# Patient Record
Sex: Female | Born: 1975 | Race: White | Hispanic: No | Marital: Single | Smoking: Former smoker
Health system: Southern US, Community
[De-identification: ages and names within clinical notes are randomized; demographics above are authoritative.]

## PROBLEM LIST (undated history)

## (undated) DIAGNOSIS — F419 Anxiety disorder, unspecified: Secondary | ICD-10-CM

## (undated) DIAGNOSIS — F4310 Post-traumatic stress disorder, unspecified: Secondary | ICD-10-CM

## (undated) DIAGNOSIS — E079 Disorder of thyroid, unspecified: Secondary | ICD-10-CM

## (undated) DIAGNOSIS — J45909 Unspecified asthma, uncomplicated: Secondary | ICD-10-CM

## (undated) DIAGNOSIS — F329 Major depressive disorder, single episode, unspecified: Secondary | ICD-10-CM

## (undated) DIAGNOSIS — F32A Depression, unspecified: Secondary | ICD-10-CM

## (undated) HISTORY — DX: Depression, unspecified: F32.A

## (undated) HISTORY — DX: Post-traumatic stress disorder, unspecified: F43.10

## (undated) HISTORY — DX: Anxiety disorder, unspecified: F41.9

## (undated) HISTORY — DX: Unspecified asthma, uncomplicated: J45.909

## (undated) HISTORY — DX: Major depressive disorder, single episode, unspecified: F32.9

## (undated) HISTORY — DX: Disorder of thyroid, unspecified: E07.9

---

## 2012-09-09 DIAGNOSIS — N978 Female infertility of other origin: Secondary | ICD-10-CM | POA: Insufficient documentation

## 2015-08-26 DIAGNOSIS — R0602 Shortness of breath: Secondary | ICD-10-CM | POA: Diagnosis not present

## 2015-08-26 DIAGNOSIS — J019 Acute sinusitis, unspecified: Secondary | ICD-10-CM | POA: Diagnosis not present

## 2015-08-26 DIAGNOSIS — Z79899 Other long term (current) drug therapy: Secondary | ICD-10-CM | POA: Diagnosis not present

## 2015-08-26 DIAGNOSIS — J383 Other diseases of vocal cords: Secondary | ICD-10-CM | POA: Insufficient documentation

## 2015-08-26 DIAGNOSIS — R064 Hyperventilation: Secondary | ICD-10-CM | POA: Diagnosis not present

## 2015-08-26 DIAGNOSIS — Z7951 Long term (current) use of inhaled steroids: Secondary | ICD-10-CM | POA: Diagnosis not present

## 2015-08-26 DIAGNOSIS — E039 Hypothyroidism, unspecified: Secondary | ICD-10-CM | POA: Diagnosis not present

## 2015-08-26 DIAGNOSIS — J4530 Mild persistent asthma, uncomplicated: Secondary | ICD-10-CM | POA: Insufficient documentation

## 2015-09-01 DIAGNOSIS — M79672 Pain in left foot: Secondary | ICD-10-CM | POA: Diagnosis not present

## 2015-09-01 DIAGNOSIS — M7741 Metatarsalgia, right foot: Secondary | ICD-10-CM | POA: Diagnosis not present

## 2015-09-01 DIAGNOSIS — R262 Difficulty in walking, not elsewhere classified: Secondary | ICD-10-CM | POA: Diagnosis not present

## 2015-09-01 DIAGNOSIS — D2372 Other benign neoplasm of skin of left lower limb, including hip: Secondary | ICD-10-CM | POA: Diagnosis not present

## 2015-09-01 DIAGNOSIS — M7742 Metatarsalgia, left foot: Secondary | ICD-10-CM | POA: Diagnosis not present

## 2015-09-06 DIAGNOSIS — Z79899 Other long term (current) drug therapy: Secondary | ICD-10-CM | POA: Diagnosis not present

## 2015-09-06 DIAGNOSIS — Z87891 Personal history of nicotine dependence: Secondary | ICD-10-CM | POA: Diagnosis not present

## 2015-09-06 DIAGNOSIS — J300 Vasomotor rhinitis: Secondary | ICD-10-CM | POA: Diagnosis not present

## 2015-09-06 DIAGNOSIS — E039 Hypothyroidism, unspecified: Secondary | ICD-10-CM | POA: Diagnosis not present

## 2015-09-06 DIAGNOSIS — J4530 Mild persistent asthma, uncomplicated: Secondary | ICD-10-CM | POA: Diagnosis not present

## 2015-09-06 DIAGNOSIS — J383 Other diseases of vocal cords: Secondary | ICD-10-CM | POA: Diagnosis not present

## 2015-09-06 DIAGNOSIS — R0982 Postnasal drip: Secondary | ICD-10-CM | POA: Diagnosis not present

## 2015-09-07 DIAGNOSIS — R0982 Postnasal drip: Secondary | ICD-10-CM | POA: Insufficient documentation

## 2015-09-09 DIAGNOSIS — M71572 Other bursitis, not elsewhere classified, left ankle and foot: Secondary | ICD-10-CM | POA: Diagnosis not present

## 2015-09-09 DIAGNOSIS — M2011 Hallux valgus (acquired), right foot: Secondary | ICD-10-CM | POA: Diagnosis not present

## 2015-09-09 DIAGNOSIS — M79671 Pain in right foot: Secondary | ICD-10-CM | POA: Diagnosis not present

## 2015-09-09 DIAGNOSIS — M79672 Pain in left foot: Secondary | ICD-10-CM | POA: Diagnosis not present

## 2015-09-09 DIAGNOSIS — M71571 Other bursitis, not elsewhere classified, right ankle and foot: Secondary | ICD-10-CM | POA: Diagnosis not present

## 2015-09-09 DIAGNOSIS — M2012 Hallux valgus (acquired), left foot: Secondary | ICD-10-CM | POA: Diagnosis not present

## 2015-09-27 DIAGNOSIS — M2012 Hallux valgus (acquired), left foot: Secondary | ICD-10-CM | POA: Diagnosis not present

## 2015-09-27 DIAGNOSIS — M79672 Pain in left foot: Secondary | ICD-10-CM | POA: Diagnosis not present

## 2015-09-27 DIAGNOSIS — M7741 Metatarsalgia, right foot: Secondary | ICD-10-CM | POA: Diagnosis not present

## 2015-09-27 DIAGNOSIS — M7742 Metatarsalgia, left foot: Secondary | ICD-10-CM | POA: Diagnosis not present

## 2015-09-27 DIAGNOSIS — R262 Difficulty in walking, not elsewhere classified: Secondary | ICD-10-CM | POA: Diagnosis not present

## 2015-09-27 DIAGNOSIS — M2011 Hallux valgus (acquired), right foot: Secondary | ICD-10-CM | POA: Diagnosis not present

## 2015-09-27 DIAGNOSIS — M79671 Pain in right foot: Secondary | ICD-10-CM | POA: Diagnosis not present

## 2015-10-04 DIAGNOSIS — J383 Other diseases of vocal cords: Secondary | ICD-10-CM | POA: Diagnosis not present

## 2015-10-04 DIAGNOSIS — R490 Dysphonia: Secondary | ICD-10-CM | POA: Diagnosis not present

## 2015-11-07 DIAGNOSIS — M2012 Hallux valgus (acquired), left foot: Secondary | ICD-10-CM | POA: Diagnosis not present

## 2015-11-07 DIAGNOSIS — M79672 Pain in left foot: Secondary | ICD-10-CM | POA: Diagnosis not present

## 2015-11-07 DIAGNOSIS — S90822A Blister (nonthermal), left foot, initial encounter: Secondary | ICD-10-CM | POA: Diagnosis not present

## 2015-12-16 DIAGNOSIS — E039 Hypothyroidism, unspecified: Secondary | ICD-10-CM | POA: Diagnosis not present

## 2015-12-29 DIAGNOSIS — B373 Candidiasis of vulva and vagina: Secondary | ICD-10-CM | POA: Diagnosis not present

## 2015-12-29 DIAGNOSIS — L709 Acne, unspecified: Secondary | ICD-10-CM | POA: Diagnosis not present

## 2015-12-29 DIAGNOSIS — Z131 Encounter for screening for diabetes mellitus: Secondary | ICD-10-CM | POA: Diagnosis not present

## 2015-12-29 LAB — VITAMIN D 25 HYDROXY (VIT D DEFICIENCY, FRACTURES): Vit D, 25-Hydroxy: 57

## 2015-12-29 LAB — CBC AND DIFFERENTIAL: HEMOGLOBIN: 13.6 g/dL (ref 12.0–16.0)

## 2015-12-29 LAB — HEMOGLOBIN A1C: Hemoglobin A1C: 4.8

## 2015-12-29 LAB — BASIC METABOLIC PANEL: Creatinine: 0.8 mg/dL (ref 0.5–1.1)

## 2015-12-29 LAB — VITAMIN B12: VITAMIN B12: 931

## 2016-01-31 ENCOUNTER — Ambulatory Visit (INDEPENDENT_AMBULATORY_CARE_PROVIDER_SITE_OTHER): Payer: Self-pay | Admitting: Family Medicine

## 2016-01-31 DIAGNOSIS — Z5329 Procedure and treatment not carried out because of patient's decision for other reasons: Secondary | ICD-10-CM

## 2016-01-31 NOTE — Progress Notes (Signed)
No show 1st visit. Reschedule PRN.

## 2016-03-28 ENCOUNTER — Encounter: Payer: Self-pay | Admitting: Family Medicine

## 2016-03-28 ENCOUNTER — Ambulatory Visit (INDEPENDENT_AMBULATORY_CARE_PROVIDER_SITE_OTHER): Payer: Medicare Other | Admitting: Family Medicine

## 2016-03-28 VITALS — BP 119/79 | HR 107 | Temp 98.6°F | Ht 65.0 in | Wt 178.0 lb

## 2016-03-28 DIAGNOSIS — M169 Osteoarthritis of hip, unspecified: Secondary | ICD-10-CM | POA: Insufficient documentation

## 2016-03-28 DIAGNOSIS — F32A Depression, unspecified: Secondary | ICD-10-CM | POA: Insufficient documentation

## 2016-03-28 DIAGNOSIS — F4310 Post-traumatic stress disorder, unspecified: Secondary | ICD-10-CM | POA: Insufficient documentation

## 2016-03-28 DIAGNOSIS — J4530 Mild persistent asthma, uncomplicated: Secondary | ICD-10-CM

## 2016-03-28 DIAGNOSIS — E559 Vitamin D deficiency, unspecified: Secondary | ICD-10-CM | POA: Insufficient documentation

## 2016-03-28 DIAGNOSIS — F419 Anxiety disorder, unspecified: Secondary | ICD-10-CM | POA: Diagnosis not present

## 2016-03-28 DIAGNOSIS — L709 Acne, unspecified: Secondary | ICD-10-CM | POA: Insufficient documentation

## 2016-03-28 DIAGNOSIS — E039 Hypothyroidism, unspecified: Secondary | ICD-10-CM | POA: Insufficient documentation

## 2016-03-28 DIAGNOSIS — E669 Obesity, unspecified: Secondary | ICD-10-CM | POA: Insufficient documentation

## 2016-03-28 DIAGNOSIS — F329 Major depressive disorder, single episode, unspecified: Secondary | ICD-10-CM

## 2016-03-28 DIAGNOSIS — L700 Acne vulgaris: Secondary | ICD-10-CM

## 2016-03-28 MED ORDER — NAPROXEN-ESOMEPRAZOLE 500-20 MG PO TBEC: 1.0000 | DELAYED_RELEASE_TABLET | Freq: Two times a day (BID) | ORAL | 12 refills | Status: DC | PRN

## 2016-03-28 MED ORDER — PROAIR HFA 108 (90 BASE) MCG/ACT IN AERS: 1.0000 | INHALATION_SPRAY | Freq: Four times a day (QID) | RESPIRATORY_TRACT | 11 refills | Status: DC | PRN

## 2016-03-28 MED ORDER — DULOXETINE HCL 60 MG PO CPEP: 60.0000 mg | ORAL_CAPSULE | Freq: Every day | ORAL | 6 refills | Status: DC

## 2016-03-28 MED ORDER — CLINDAMYCIN PHOSPHATE 1 % EX GEL: CUTANEOUS | 5 refills | Status: AC

## 2016-03-28 MED ORDER — ADVAIR DISKUS 250-50 MCG/DOSE IN AEPB: 1.0000 | INHALATION_SPRAY | Freq: Two times a day (BID) | RESPIRATORY_TRACT | 6 refills | Status: DC | PRN

## 2016-03-28 MED ORDER — DULOXETINE HCL 30 MG PO CPEP: 30.0000 mg | ORAL_CAPSULE | Freq: Every day | ORAL | 6 refills | Status: DC

## 2016-03-28 MED ORDER — LEVOTHYROXINE SODIUM 100 MCG PO TABS: 100.0000 ug | ORAL_TABLET | Freq: Every day | ORAL | 1 refills | Status: AC

## 2016-03-28 NOTE — Patient Instructions (Signed)
Thank you for coming in today. Lets get records from your other office.  Return in 1 month or so.  Restart medicine.  Let me know if you do not hear form therapy.

## 2016-03-28 NOTE — Progress Notes (Signed)
Pt here to establish care.
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
## 2016-03-29 ENCOUNTER — Encounter: Payer: Self-pay | Admitting: Family Medicine

## 2016-03-30 ENCOUNTER — Encounter: Payer: Self-pay | Admitting: Family Medicine

## 2016-03-30 MED ORDER — DULOXETINE HCL 30 MG PO CPEP: 90.0000 mg | ORAL_CAPSULE | Freq: Every day | ORAL | 6 refills | Status: AC

## 2016-03-30 MED ORDER — CYCLOBENZAPRINE HCL 5 MG PO TABS: 5.0000 mg | ORAL_TABLET | Freq: Three times a day (TID) | ORAL | 1 refills | Status: DC | PRN

## 2016-04-03 MED ORDER — FLUCONAZOLE 150 MG PO TABS: 150.0000 mg | ORAL_TABLET | Freq: Once | ORAL | 0 refills | Status: AC

## 2016-04-25 ENCOUNTER — Ambulatory Visit: Payer: Medicare Other | Admitting: Family Medicine

## 2016-04-26 ENCOUNTER — Encounter: Payer: Self-pay | Admitting: Family Medicine

## 2016-06-12 ENCOUNTER — Other Ambulatory Visit: Payer: Self-pay | Admitting: Family Medicine

## 2016-06-12 MED ORDER — ALBUTEROL SULFATE HFA 108 (90 BASE) MCG/ACT IN AERS: 2.0000 | INHALATION_SPRAY | Freq: Four times a day (QID) | RESPIRATORY_TRACT | 2 refills | Status: AC | PRN

## 2016-06-12 NOTE — Progress Notes (Signed)
Letter received from insurance company. Pro-air brand is not covered. Will switch to Ventolin.

## 2016-06-28 ENCOUNTER — Encounter: Payer: Self-pay | Admitting: Family Medicine

## 2016-07-06 ENCOUNTER — Other Ambulatory Visit: Payer: Self-pay | Admitting: Family Medicine

## 2016-07-08 ENCOUNTER — Encounter: Payer: Self-pay | Admitting: Family Medicine

## 2016-07-10 MED ORDER — NAPROXEN 500 MG PO TABS: 500.0000 mg | ORAL_TABLET | Freq: Two times a day (BID) | ORAL | 3 refills | Status: DC

## 2016-07-10 MED ORDER — OMEPRAZOLE 40 MG PO CPDR
40.0000 mg | DELAYED_RELEASE_CAPSULE | Freq: Every day | ORAL | 3 refills | Status: AC
Start: 2016-07-10 — End: ?

## 2016-07-16 DIAGNOSIS — R259 Unspecified abnormal involuntary movements: Secondary | ICD-10-CM | POA: Diagnosis not present

## 2016-07-16 DIAGNOSIS — R443 Hallucinations, unspecified: Secondary | ICD-10-CM | POA: Diagnosis not present

## 2016-07-16 DIAGNOSIS — F430 Acute stress reaction: Secondary | ICD-10-CM | POA: Diagnosis not present

## 2016-07-18 DIAGNOSIS — F333 Major depressive disorder, recurrent, severe with psychotic symptoms: Secondary | ICD-10-CM | POA: Diagnosis not present

## 2016-07-18 DIAGNOSIS — F29 Unspecified psychosis not due to a substance or known physiological condition: Secondary | ICD-10-CM | POA: Diagnosis not present

## 2016-07-19 DIAGNOSIS — F22 Delusional disorders: Secondary | ICD-10-CM | POA: Diagnosis not present

## 2016-07-22 DIAGNOSIS — F332 Major depressive disorder, recurrent severe without psychotic features: Secondary | ICD-10-CM | POA: Diagnosis not present

## 2016-07-25 DIAGNOSIS — F333 Major depressive disorder, recurrent, severe with psychotic symptoms: Secondary | ICD-10-CM | POA: Diagnosis not present

## 2016-07-25 DIAGNOSIS — F22 Delusional disorders: Secondary | ICD-10-CM | POA: Diagnosis not present

## 2016-10-12 DIAGNOSIS — R197 Diarrhea, unspecified: Secondary | ICD-10-CM | POA: Diagnosis not present

## 2016-10-12 DIAGNOSIS — R110 Nausea: Secondary | ICD-10-CM | POA: Diagnosis not present

## 2016-10-30 DIAGNOSIS — S0990XA Unspecified injury of head, initial encounter: Secondary | ICD-10-CM | POA: Diagnosis not present

## 2016-10-30 DIAGNOSIS — R51 Headache: Secondary | ICD-10-CM | POA: Diagnosis not present

## 2016-10-30 DIAGNOSIS — F609 Personality disorder, unspecified: Secondary | ICD-10-CM | POA: Diagnosis not present

## 2016-10-30 DIAGNOSIS — Z9114 Patient's other noncompliance with medication regimen: Secondary | ICD-10-CM | POA: Diagnosis not present

## 2016-10-30 DIAGNOSIS — F333 Major depressive disorder, recurrent, severe with psychotic symptoms: Secondary | ICD-10-CM | POA: Diagnosis not present

## 2016-10-30 DIAGNOSIS — S199XXA Unspecified injury of neck, initial encounter: Secondary | ICD-10-CM | POA: Diagnosis not present

## 2016-10-30 DIAGNOSIS — F69 Unspecified disorder of adult personality and behavior: Secondary | ICD-10-CM | POA: Diagnosis not present

## 2016-10-30 DIAGNOSIS — F22 Delusional disorders: Secondary | ICD-10-CM | POA: Diagnosis not present

## 2016-12-20 DIAGNOSIS — R197 Diarrhea, unspecified: Secondary | ICD-10-CM | POA: Diagnosis not present

## 2016-12-21 DIAGNOSIS — R197 Diarrhea, unspecified: Secondary | ICD-10-CM | POA: Diagnosis not present

## 2017-01-03 ENCOUNTER — Other Ambulatory Visit: Payer: Self-pay | Admitting: Family Medicine

## 2017-01-06 ENCOUNTER — Other Ambulatory Visit: Payer: Self-pay | Admitting: Family Medicine

## 2017-01-12 DIAGNOSIS — R197 Diarrhea, unspecified: Secondary | ICD-10-CM | POA: Diagnosis not present

## 2017-01-12 DIAGNOSIS — A045 Campylobacter enteritis: Secondary | ICD-10-CM | POA: Diagnosis not present

## 2017-01-17 ENCOUNTER — Encounter: Payer: Self-pay | Admitting: Family Medicine

## 2017-01-17 ENCOUNTER — Other Ambulatory Visit: Payer: Self-pay

## 2017-01-17 MED ORDER — NAPROXEN 500 MG PO TABS
500.0000 mg | ORAL_TABLET | Freq: Two times a day (BID) | ORAL | 3 refills | Status: DC
Start: 2017-01-17 — End: 2017-05-06

## 2017-01-21 DIAGNOSIS — R197 Diarrhea, unspecified: Secondary | ICD-10-CM | POA: Diagnosis not present

## 2017-01-21 DIAGNOSIS — R631 Polydipsia: Secondary | ICD-10-CM | POA: Diagnosis not present

## 2017-01-21 DIAGNOSIS — K591 Functional diarrhea: Secondary | ICD-10-CM | POA: Diagnosis not present

## 2017-01-21 DIAGNOSIS — Z79899 Other long term (current) drug therapy: Secondary | ICD-10-CM | POA: Diagnosis not present

## 2017-01-22 ENCOUNTER — Encounter: Payer: Self-pay | Admitting: Family Medicine

## 2017-01-29 DIAGNOSIS — R197 Diarrhea, unspecified: Secondary | ICD-10-CM | POA: Diagnosis not present

## 2017-02-25 DIAGNOSIS — R197 Diarrhea, unspecified: Secondary | ICD-10-CM | POA: Diagnosis not present

## 2017-02-25 DIAGNOSIS — F4310 Post-traumatic stress disorder, unspecified: Secondary | ICD-10-CM | POA: Diagnosis not present

## 2017-02-25 DIAGNOSIS — E039 Hypothyroidism, unspecified: Secondary | ICD-10-CM | POA: Diagnosis not present

## 2017-02-25 DIAGNOSIS — Z79899 Other long term (current) drug therapy: Secondary | ICD-10-CM | POA: Diagnosis not present

## 2017-02-25 DIAGNOSIS — R7989 Other specified abnormal findings of blood chemistry: Secondary | ICD-10-CM | POA: Diagnosis not present

## 2017-02-25 DIAGNOSIS — F2089 Other schizophrenia: Secondary | ICD-10-CM | POA: Diagnosis not present

## 2017-02-26 DIAGNOSIS — R197 Diarrhea, unspecified: Secondary | ICD-10-CM | POA: Diagnosis not present

## 2017-02-26 DIAGNOSIS — Z79899 Other long term (current) drug therapy: Secondary | ICD-10-CM | POA: Diagnosis not present

## 2017-02-26 DIAGNOSIS — E039 Hypothyroidism, unspecified: Secondary | ICD-10-CM | POA: Diagnosis not present

## 2017-03-08 DIAGNOSIS — Z1389 Encounter for screening for other disorder: Secondary | ICD-10-CM | POA: Diagnosis not present

## 2017-03-08 DIAGNOSIS — Z1379 Encounter for other screening for genetic and chromosomal anomalies: Secondary | ICD-10-CM | POA: Diagnosis not present

## 2017-03-08 DIAGNOSIS — R5383 Other fatigue: Secondary | ICD-10-CM | POA: Diagnosis not present

## 2017-03-08 DIAGNOSIS — E559 Vitamin D deficiency, unspecified: Secondary | ICD-10-CM | POA: Diagnosis not present

## 2017-03-08 DIAGNOSIS — Z136 Encounter for screening for cardiovascular disorders: Secondary | ICD-10-CM | POA: Diagnosis not present

## 2017-03-08 DIAGNOSIS — F339 Major depressive disorder, recurrent, unspecified: Secondary | ICD-10-CM | POA: Diagnosis not present

## 2017-03-08 DIAGNOSIS — F419 Anxiety disorder, unspecified: Secondary | ICD-10-CM | POA: Diagnosis not present

## 2017-03-08 DIAGNOSIS — E669 Obesity, unspecified: Secondary | ICD-10-CM | POA: Diagnosis not present

## 2017-03-08 DIAGNOSIS — R197 Diarrhea, unspecified: Secondary | ICD-10-CM | POA: Diagnosis not present

## 2017-03-13 DIAGNOSIS — R197 Diarrhea, unspecified: Secondary | ICD-10-CM | POA: Diagnosis not present

## 2017-03-27 DIAGNOSIS — R197 Diarrhea, unspecified: Secondary | ICD-10-CM | POA: Diagnosis not present

## 2017-04-06 ENCOUNTER — Other Ambulatory Visit: Payer: Self-pay | Admitting: Family Medicine

## 2017-04-09 MED ORDER — FLUTICASONE-SALMETEROL 250-50 MCG/DOSE IN AEPB: 1.0000 | INHALATION_SPRAY | Freq: Two times a day (BID) | RESPIRATORY_TRACT | 0 refills | Status: AC

## 2017-04-09 NOTE — Addendum Note (Signed)
Addended by: Doree Albee on: 04/09/2017 07:52 AM   Modules accepted: Orders

## 2017-05-06 ENCOUNTER — Other Ambulatory Visit: Payer: Self-pay | Admitting: Family Medicine

## 2017-05-14 DIAGNOSIS — E039 Hypothyroidism, unspecified: Secondary | ICD-10-CM | POA: Diagnosis not present

## 2017-05-14 DIAGNOSIS — N911 Secondary amenorrhea: Secondary | ICD-10-CM | POA: Diagnosis not present

## 2017-05-27 DIAGNOSIS — E039 Hypothyroidism, unspecified: Secondary | ICD-10-CM | POA: Diagnosis not present

## 2017-05-27 DIAGNOSIS — A09 Infectious gastroenteritis and colitis, unspecified: Secondary | ICD-10-CM | POA: Diagnosis not present

## 2017-05-27 DIAGNOSIS — E669 Obesity, unspecified: Secondary | ICD-10-CM | POA: Diagnosis not present

## 2017-05-27 DIAGNOSIS — Z1389 Encounter for screening for other disorder: Secondary | ICD-10-CM | POA: Diagnosis not present

## 2017-05-27 DIAGNOSIS — F4310 Post-traumatic stress disorder, unspecified: Secondary | ICD-10-CM | POA: Diagnosis not present

## 2017-05-27 DIAGNOSIS — Z23 Encounter for immunization: Secondary | ICD-10-CM | POA: Diagnosis not present

## 2017-05-27 DIAGNOSIS — J45909 Unspecified asthma, uncomplicated: Secondary | ICD-10-CM | POA: Diagnosis not present

## 2017-05-28 DIAGNOSIS — A09 Infectious gastroenteritis and colitis, unspecified: Secondary | ICD-10-CM | POA: Diagnosis not present

## 2017-06-04 DIAGNOSIS — N912 Amenorrhea, unspecified: Secondary | ICD-10-CM | POA: Diagnosis not present

## 2017-06-04 DIAGNOSIS — E039 Hypothyroidism, unspecified: Secondary | ICD-10-CM | POA: Diagnosis not present

## 2017-06-04 DIAGNOSIS — E221 Hyperprolactinemia: Secondary | ICD-10-CM | POA: Diagnosis not present

## 2017-06-18 ENCOUNTER — Other Ambulatory Visit: Payer: Self-pay | Admitting: Endocrinology

## 2017-06-18 DIAGNOSIS — E221 Hyperprolactinemia: Secondary | ICD-10-CM

## 2017-06-28 ENCOUNTER — Ambulatory Visit
Admission: RE | Admit: 2017-06-28 | Discharge: 2017-06-28 | Disposition: A | Discharge status: Home / Self Care | Payer: Medicare Other | Source: Ambulatory Visit | Attending: Endocrinology | Admitting: Endocrinology

## 2017-06-28 DIAGNOSIS — E221 Hyperprolactinemia: Secondary | ICD-10-CM | POA: Diagnosis not present

## 2017-06-28 MED ORDER — GADOBENATE DIMEGLUMINE 529 MG/ML IV SOLN
12.0000 mL | Freq: Once | INTRAVENOUS | Status: AC | PRN
  Administered 2017-06-28: 12 mL via INTRAVENOUS

## 2017-09-02 DIAGNOSIS — E039 Hypothyroidism, unspecified: Secondary | ICD-10-CM | POA: Diagnosis not present

## 2017-09-02 DIAGNOSIS — E221 Hyperprolactinemia: Secondary | ICD-10-CM | POA: Diagnosis not present

## 2017-09-07 ENCOUNTER — Other Ambulatory Visit: Payer: Self-pay | Admitting: Family Medicine

## 2017-09-22 ENCOUNTER — Other Ambulatory Visit: Payer: Self-pay | Admitting: Family Medicine

## 2017-11-04 DIAGNOSIS — E221 Hyperprolactinemia: Secondary | ICD-10-CM | POA: Diagnosis not present

## 2017-11-04 DIAGNOSIS — E039 Hypothyroidism, unspecified: Secondary | ICD-10-CM | POA: Diagnosis not present

## 2017-11-11 DIAGNOSIS — D443 Neoplasm of uncertain behavior of pituitary gland: Secondary | ICD-10-CM | POA: Diagnosis not present

## 2017-11-11 DIAGNOSIS — E039 Hypothyroidism, unspecified: Secondary | ICD-10-CM | POA: Diagnosis not present

## 2017-11-11 DIAGNOSIS — E221 Hyperprolactinemia: Secondary | ICD-10-CM | POA: Diagnosis not present

## 2017-12-18 DIAGNOSIS — M1990 Unspecified osteoarthritis, unspecified site: Secondary | ICD-10-CM | POA: Diagnosis not present

## 2017-12-18 DIAGNOSIS — R197 Diarrhea, unspecified: Secondary | ICD-10-CM | POA: Diagnosis not present

## 2017-12-18 DIAGNOSIS — R5383 Other fatigue: Secondary | ICD-10-CM | POA: Diagnosis not present

## 2017-12-18 DIAGNOSIS — E039 Hypothyroidism, unspecified: Secondary | ICD-10-CM | POA: Diagnosis not present

## 2017-12-20 DIAGNOSIS — R5383 Other fatigue: Secondary | ICD-10-CM | POA: Diagnosis not present

## 2017-12-20 DIAGNOSIS — R197 Diarrhea, unspecified: Secondary | ICD-10-CM | POA: Diagnosis not present

## 2018-02-04 ENCOUNTER — Other Ambulatory Visit: Payer: Self-pay | Admitting: Family Medicine

## 2018-02-06 DIAGNOSIS — M76822 Posterior tibial tendinitis, left leg: Secondary | ICD-10-CM | POA: Diagnosis not present

## 2018-02-06 DIAGNOSIS — M19072 Primary osteoarthritis, left ankle and foot: Secondary | ICD-10-CM | POA: Diagnosis not present

## 2018-02-06 DIAGNOSIS — M76821 Posterior tibial tendinitis, right leg: Secondary | ICD-10-CM | POA: Diagnosis not present

## 2018-02-06 DIAGNOSIS — M19071 Primary osteoarthritis, right ankle and foot: Secondary | ICD-10-CM | POA: Diagnosis not present

## 2018-02-10 DIAGNOSIS — R5383 Other fatigue: Secondary | ICD-10-CM | POA: Diagnosis not present

## 2018-02-10 DIAGNOSIS — E221 Hyperprolactinemia: Secondary | ICD-10-CM | POA: Diagnosis not present

## 2018-02-10 DIAGNOSIS — E039 Hypothyroidism, unspecified: Secondary | ICD-10-CM | POA: Diagnosis not present

## 2018-02-17 DIAGNOSIS — R5383 Other fatigue: Secondary | ICD-10-CM | POA: Diagnosis not present

## 2018-02-17 DIAGNOSIS — M1990 Unspecified osteoarthritis, unspecified site: Secondary | ICD-10-CM | POA: Diagnosis not present

## 2018-02-17 DIAGNOSIS — E221 Hyperprolactinemia: Secondary | ICD-10-CM | POA: Diagnosis not present

## 2018-02-17 DIAGNOSIS — E039 Hypothyroidism, unspecified: Secondary | ICD-10-CM | POA: Diagnosis not present

## 2018-02-17 DIAGNOSIS — Z0000 Encounter for general adult medical examination without abnormal findings: Secondary | ICD-10-CM | POA: Diagnosis not present

## 2018-02-17 DIAGNOSIS — D443 Neoplasm of uncertain behavior of pituitary gland: Secondary | ICD-10-CM | POA: Diagnosis not present

## 2018-02-17 DIAGNOSIS — R197 Diarrhea, unspecified: Secondary | ICD-10-CM | POA: Diagnosis not present

## 2018-02-17 DIAGNOSIS — Z1151 Encounter for screening for human papillomavirus (HPV): Secondary | ICD-10-CM | POA: Diagnosis not present

## 2018-02-17 DIAGNOSIS — Z01419 Encounter for gynecological examination (general) (routine) without abnormal findings: Secondary | ICD-10-CM | POA: Diagnosis not present

## 2018-02-21 DIAGNOSIS — N912 Amenorrhea, unspecified: Secondary | ICD-10-CM | POA: Diagnosis not present

## 2018-02-21 DIAGNOSIS — R197 Diarrhea, unspecified: Secondary | ICD-10-CM | POA: Diagnosis not present

## 2018-02-21 DIAGNOSIS — D443 Neoplasm of uncertain behavior of pituitary gland: Secondary | ICD-10-CM | POA: Diagnosis not present

## 2018-02-21 DIAGNOSIS — R5383 Other fatigue: Secondary | ICD-10-CM | POA: Diagnosis not present

## 2018-02-21 DIAGNOSIS — M1990 Unspecified osteoarthritis, unspecified site: Secondary | ICD-10-CM | POA: Diagnosis not present

## 2018-02-21 DIAGNOSIS — E221 Hyperprolactinemia: Secondary | ICD-10-CM | POA: Diagnosis not present

## 2018-03-13 DIAGNOSIS — R197 Diarrhea, unspecified: Secondary | ICD-10-CM | POA: Diagnosis not present

## 2018-03-13 DIAGNOSIS — Z1212 Encounter for screening for malignant neoplasm of rectum: Secondary | ICD-10-CM | POA: Diagnosis not present

## 2018-05-05 ENCOUNTER — Other Ambulatory Visit: Payer: Self-pay | Admitting: Family Medicine

## 2018-06-05 DIAGNOSIS — F29 Unspecified psychosis not due to a substance or known physiological condition: Secondary | ICD-10-CM | POA: Diagnosis not present

## 2018-06-17 IMAGING — MR MR HEAD WO/W CM
14 of 19 series · 31 of 48 positions shown · IV contrast (12 ml multihance)
Comparison: None.

CLINICAL DATA: Hyperprolactinemia.

EXAM:
MRI HEAD WITHOUT AND WITH CONTRAST
TECHNIQUE: Multiplanar, multiecho pulse sequences of the brain and surrounding
structures were obtained without and with intravenous contrast.
CONTRAST:  12mL MULTIHANCE GADOBENATE DIMEGLUMINE 529 MG/ML IV SOLN

[Series 2: T1 · sagittal · 5.0mm · 0.45mm/px · 1 of 21 slices shown]
[im 1/21]
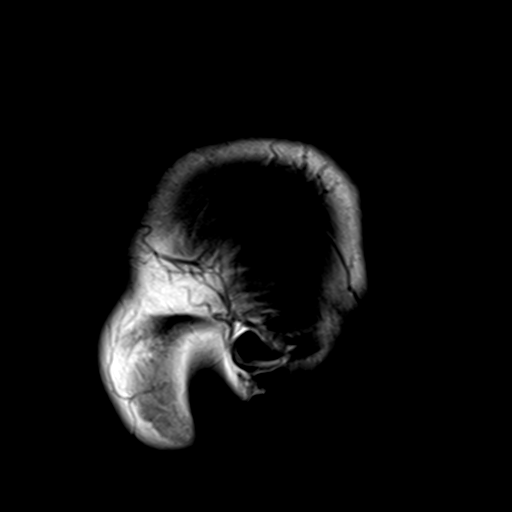

[Series 3: DWI · axial · 3.0mm · 1.80mm/px · z∈[-46,+99]mm · 8 of 100 slices shown]
[im 1/100]
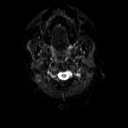
[im 12/100]
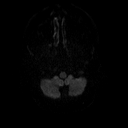
[im 34/100]
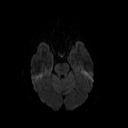
[im 45/100]
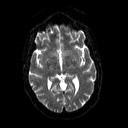
[im 56/100]
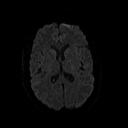
[im 67/100]
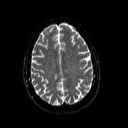
[im 89/100]
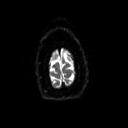
[im 100/100]
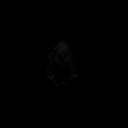

[Series 4: dwi_adc · axial · 3.0mm · 1.80mm/px · z∈[-46,+99]mm · 5 of 47 slices shown]
[im 1/47]
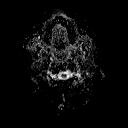
[im 12/47]
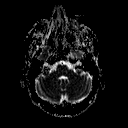
[im 24/47]
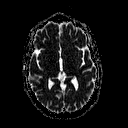
[im 35/47]
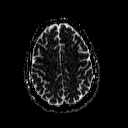
[im 47/47]
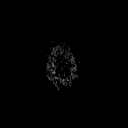

[Series 6: FLAIR · axial · 3.0mm · 0.45mm/px · z∈[-49,+102]mm · 4 of 34 slices shown]
[im 1/34]
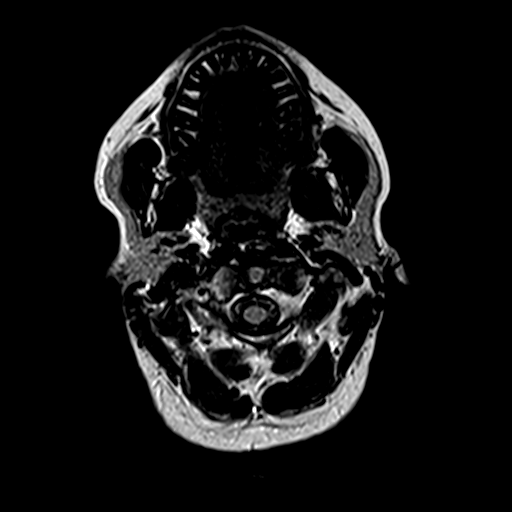
[im 12/34]
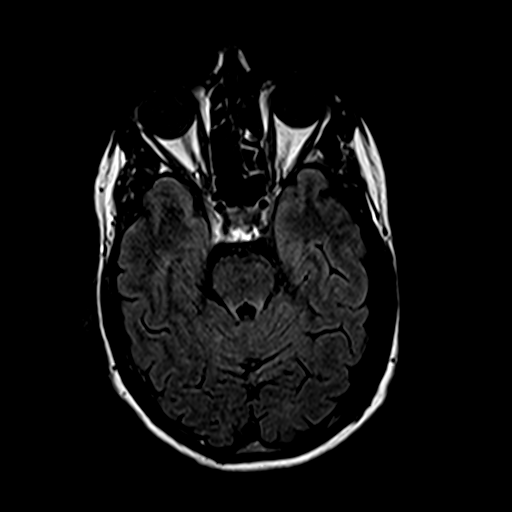
[im 23/34]
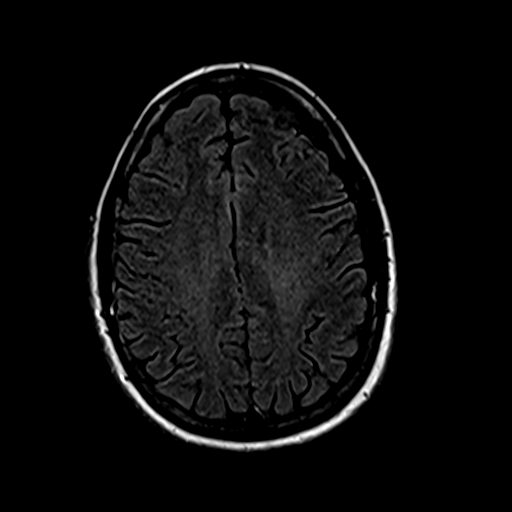
[im 34/34]
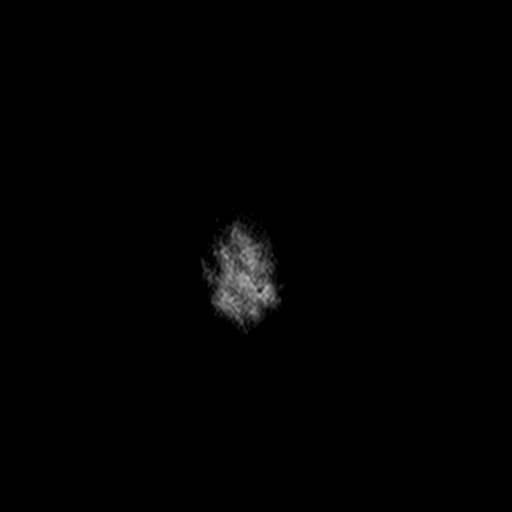

[Series 7: axial grad (blood) · axial · 5.0mm · 0.45mm/px · z∈[-46,+101]mm · 2 of 24 slices shown]
[im 1/24]
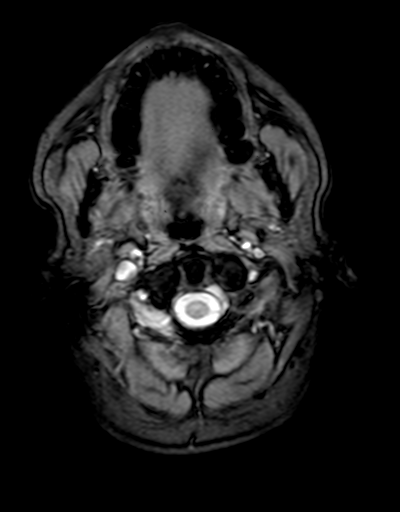
[im 24/24]
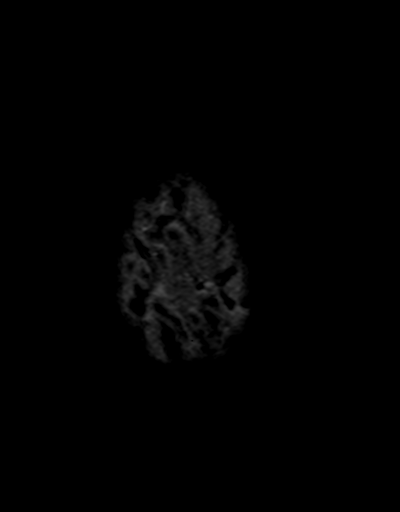

[Series 8: T2 · axial · 5.0mm · 0.36mm/px · z∈[-46,+101]mm · 2 of 24 slices shown]
[im 1/24]
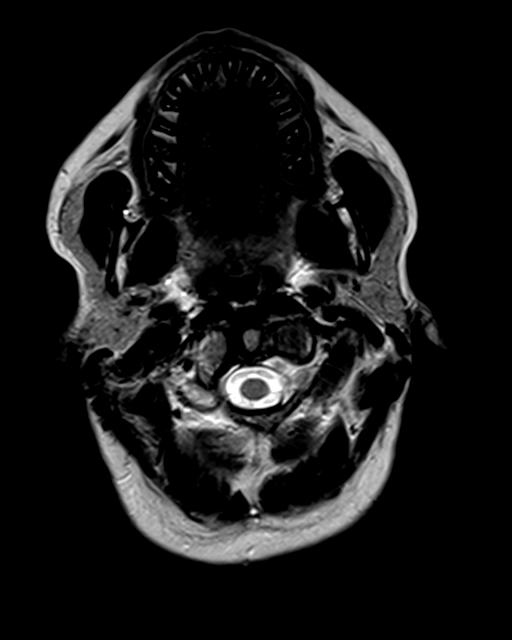
[im 24/24]
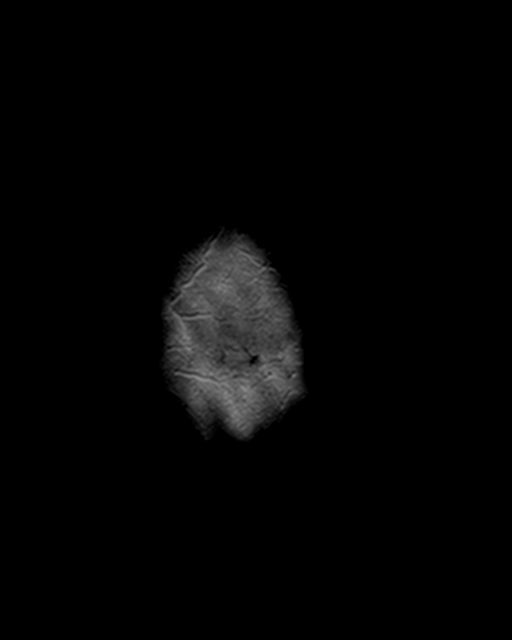

[Series 11: sag 3mm · sagittal · 3.0mm · 0.33mm/px · 1 of 13 slices shown]
[im 1/13]
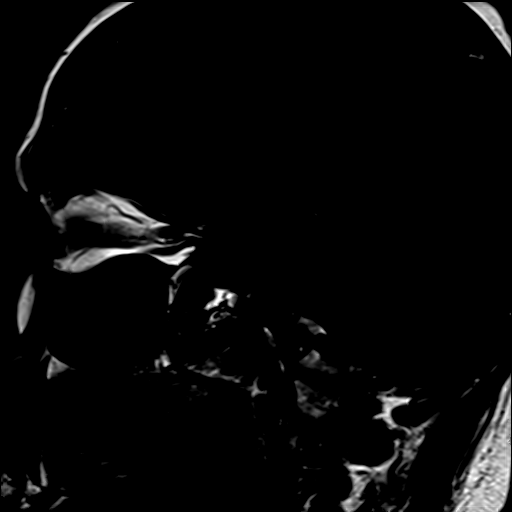

[Series 12: cor 3mm · coronal · 3.0mm · 0.33mm/px · 2 of 15 slices shown]
[im 1/15]
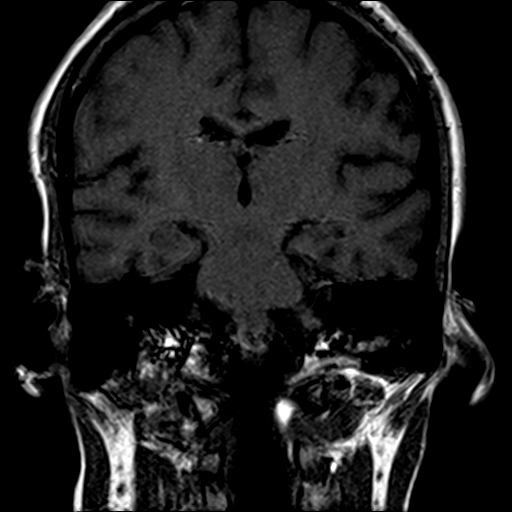
[im 15/15]
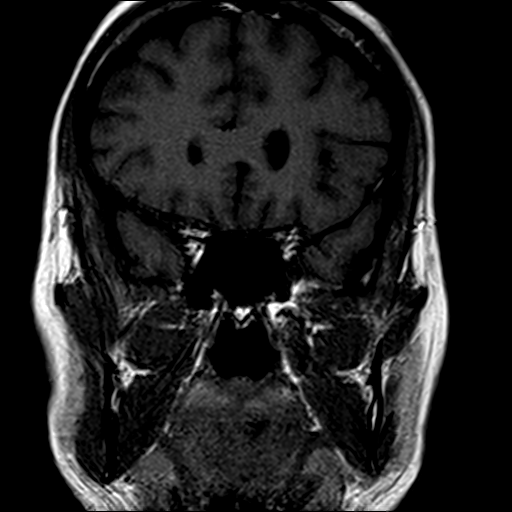

[Series 13: pre cor dynamic · coronal · non-contrast · 3.0mm · 0.35mm/px · 1 of 7 slices shown]
[im 1/7]
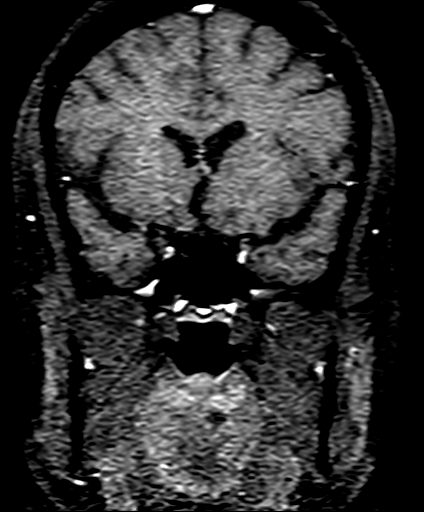

[Series 14: post fs cor · coronal · 3.0mm · 0.35mm/px · 1 of 7 slices shown (1 of 5)]
[im 1/7]
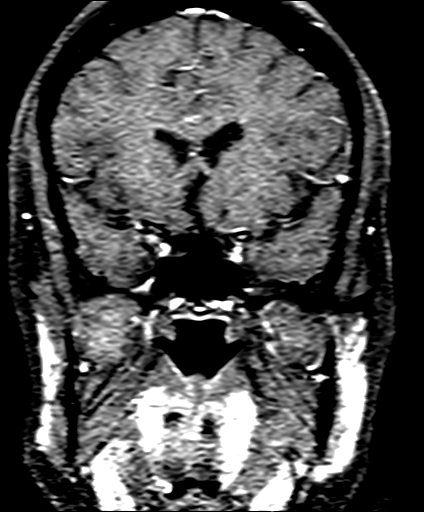

[Series 15: post fs cor · coronal · 3.0mm · 0.35mm/px · 1 of 7 slices shown (2 of 5)]
[im 1/7]
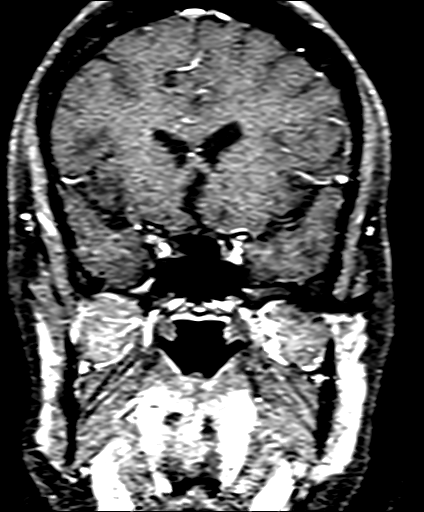

[Series 16: post fs cor · coronal · 3.0mm · 0.35mm/px · 1 of 7 slices shown (3 of 5)]
[im 1/7]
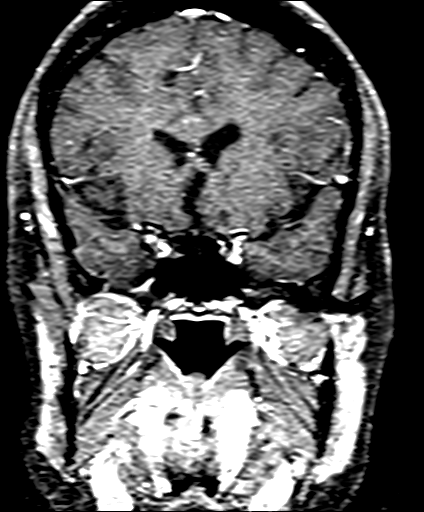

[Series 17: post fs cor · coronal · 3.0mm · 0.35mm/px · 1 of 7 slices shown (4 of 5)]
[im 1/7]
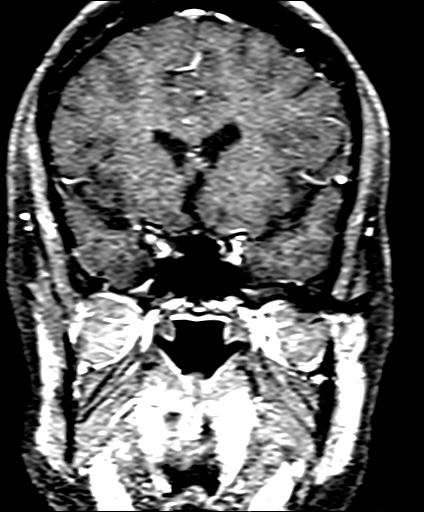

[Series 18: post fs cor · coronal · 3.0mm · 0.35mm/px · 1 of 7 slices shown (5 of 5)]
[im 1/7]
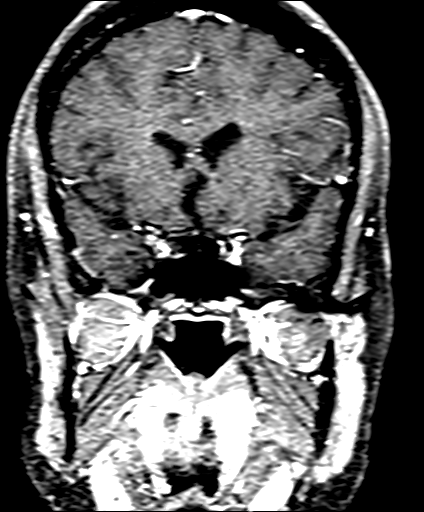

[31 of 48 positions shown; findings below may reference images not displayed]

FINDINGS: Brain: Conventional imaging the brain is unremarkable. No acute
infarct, hemorrhage, or mass lesion is present. The ventricles are
of normal size. No significant extraaxial fluid collection is
present. No significant white matter disease is present. The
brainstem and cerebellum are normal. The internal auditory canals
are within normal limits bilaterally.

Postcontrast images through the brain are unremarkable.

Vascular: Flow is present in the major intracranial arteries.

Skull and upper cervical spine: The skullbase is within normal
limits. The craniocervical junction is normal.

Sinuses/Orbits: The globes and orbits are within normal limits. Mild
mucosal thickening is present in the right maxillary sinus and
scattered throughout the ethmoid air cells bilaterally. Mild mucosal
thickening is present in the right frontal sinus and bilateral
sphenoid sinuses. No fluid levels are present. The mastoid air cells
are clear.

Other: Dedicated imaging of the sella turcica demonstrates a grossly
normal size and morphology to the pituitary gland. A subtle 2 mm
area of delayed enhancement is present inferiorly in the right side
of the gland. There is slight leftward deviation of the pituitary
stalk. The cavernous sinus is normal bilaterally. Optic chiasm is
unremarkable.
IMPRESSION: 1. Possible 2 mm pituitary microadenoma inferiorly on the right side
of the gland.
2. No other focal pituitary lesion.
3. Normal MRI of the brain otherwise.
4. Scattered mild mucosal thickening throughout the paranasal
sinuses without fluid levels to suggest acute sinusitis.

## 2018-08-19 DIAGNOSIS — K529 Noninfective gastroenteritis and colitis, unspecified: Secondary | ICD-10-CM | POA: Diagnosis not present

## 2018-08-19 DIAGNOSIS — J019 Acute sinusitis, unspecified: Secondary | ICD-10-CM | POA: Diagnosis not present

## 2018-08-19 DIAGNOSIS — Z79899 Other long term (current) drug therapy: Secondary | ICD-10-CM | POA: Diagnosis not present

## 2018-08-20 DIAGNOSIS — K529 Noninfective gastroenteritis and colitis, unspecified: Secondary | ICD-10-CM | POA: Diagnosis not present

## 2018-09-05 DIAGNOSIS — R252 Cramp and spasm: Secondary | ICD-10-CM | POA: Diagnosis not present

## 2018-09-05 DIAGNOSIS — N912 Amenorrhea, unspecified: Secondary | ICD-10-CM | POA: Diagnosis not present

## 2018-09-05 DIAGNOSIS — I10 Essential (primary) hypertension: Secondary | ICD-10-CM | POA: Diagnosis not present

## 2018-09-05 DIAGNOSIS — Z7689 Persons encountering health services in other specified circumstances: Secondary | ICD-10-CM | POA: Diagnosis not present

## 2018-09-05 DIAGNOSIS — K529 Noninfective gastroenteritis and colitis, unspecified: Secondary | ICD-10-CM | POA: Diagnosis not present

## 2018-09-05 DIAGNOSIS — E039 Hypothyroidism, unspecified: Secondary | ICD-10-CM | POA: Diagnosis not present

## 2018-09-05 DIAGNOSIS — Z23 Encounter for immunization: Secondary | ICD-10-CM | POA: Diagnosis not present

## 2018-09-06 DIAGNOSIS — I10 Essential (primary) hypertension: Secondary | ICD-10-CM | POA: Diagnosis not present

## 2018-09-06 DIAGNOSIS — K529 Noninfective gastroenteritis and colitis, unspecified: Secondary | ICD-10-CM | POA: Diagnosis not present

## 2018-09-15 DIAGNOSIS — J069 Acute upper respiratory infection, unspecified: Secondary | ICD-10-CM | POA: Diagnosis not present

## 2018-09-15 DIAGNOSIS — M1990 Unspecified osteoarthritis, unspecified site: Secondary | ICD-10-CM | POA: Diagnosis not present

## 2018-09-15 DIAGNOSIS — Z6835 Body mass index (BMI) 35.0-35.9, adult: Secondary | ICD-10-CM | POA: Diagnosis not present

## 2018-09-15 DIAGNOSIS — Z1331 Encounter for screening for depression: Secondary | ICD-10-CM | POA: Diagnosis not present

## 2018-09-15 DIAGNOSIS — E039 Hypothyroidism, unspecified: Secondary | ICD-10-CM | POA: Diagnosis not present

## 2018-09-15 DIAGNOSIS — K591 Functional diarrhea: Secondary | ICD-10-CM | POA: Diagnosis not present

## 2018-09-16 DIAGNOSIS — K591 Functional diarrhea: Secondary | ICD-10-CM | POA: Diagnosis not present

## 2018-09-24 DIAGNOSIS — E669 Obesity, unspecified: Secondary | ICD-10-CM | POA: Diagnosis not present

## 2018-09-24 DIAGNOSIS — R197 Diarrhea, unspecified: Secondary | ICD-10-CM | POA: Diagnosis not present

## 2018-09-24 DIAGNOSIS — M1612 Unilateral primary osteoarthritis, left hip: Secondary | ICD-10-CM | POA: Diagnosis not present

## 2018-09-24 DIAGNOSIS — F4310 Post-traumatic stress disorder, unspecified: Secondary | ICD-10-CM | POA: Diagnosis not present

## 2018-09-24 DIAGNOSIS — E039 Hypothyroidism, unspecified: Secondary | ICD-10-CM | POA: Diagnosis not present

## 2018-10-01 DIAGNOSIS — Z1231 Encounter for screening mammogram for malignant neoplasm of breast: Secondary | ICD-10-CM | POA: Diagnosis not present

## 2018-10-01 DIAGNOSIS — K529 Noninfective gastroenteritis and colitis, unspecified: Secondary | ICD-10-CM | POA: Diagnosis not present

## 2018-10-01 DIAGNOSIS — F329 Major depressive disorder, single episode, unspecified: Secondary | ICD-10-CM | POA: Diagnosis not present

## 2018-10-01 DIAGNOSIS — Z79899 Other long term (current) drug therapy: Secondary | ICD-10-CM | POA: Diagnosis not present

## 2018-10-01 DIAGNOSIS — Z13220 Encounter for screening for lipoid disorders: Secondary | ICD-10-CM | POA: Diagnosis not present

## 2018-10-01 DIAGNOSIS — E039 Hypothyroidism, unspecified: Secondary | ICD-10-CM | POA: Diagnosis not present

## 2018-10-01 DIAGNOSIS — M1990 Unspecified osteoarthritis, unspecified site: Secondary | ICD-10-CM | POA: Diagnosis not present

## 2018-10-01 DIAGNOSIS — F419 Anxiety disorder, unspecified: Secondary | ICD-10-CM | POA: Diagnosis not present

## 2018-10-01 DIAGNOSIS — Z114 Encounter for screening for human immunodeficiency virus [HIV]: Secondary | ICD-10-CM | POA: Diagnosis not present

## 2018-10-01 DIAGNOSIS — J4520 Mild intermittent asthma, uncomplicated: Secondary | ICD-10-CM | POA: Diagnosis not present

## 2018-10-13 DIAGNOSIS — F29 Unspecified psychosis not due to a substance or known physiological condition: Secondary | ICD-10-CM | POA: Diagnosis not present

## 2019-01-22 DIAGNOSIS — Z1231 Encounter for screening mammogram for malignant neoplasm of breast: Secondary | ICD-10-CM | POA: Diagnosis not present

## 2019-04-03 DIAGNOSIS — K130 Diseases of lips: Secondary | ICD-10-CM | POA: Diagnosis not present

## 2019-04-03 DIAGNOSIS — L0100 Impetigo, unspecified: Secondary | ICD-10-CM | POA: Diagnosis not present

## 2019-07-14 DIAGNOSIS — F419 Anxiety disorder, unspecified: Secondary | ICD-10-CM | POA: Diagnosis not present

## 2019-07-14 DIAGNOSIS — Z23 Encounter for immunization: Secondary | ICD-10-CM | POA: Diagnosis not present

## 2019-07-14 DIAGNOSIS — R197 Diarrhea, unspecified: Secondary | ICD-10-CM | POA: Diagnosis not present

## 2019-07-14 DIAGNOSIS — E039 Hypothyroidism, unspecified: Secondary | ICD-10-CM | POA: Diagnosis not present

## 2019-07-14 DIAGNOSIS — L309 Dermatitis, unspecified: Secondary | ICD-10-CM | POA: Diagnosis not present

## 2019-07-14 DIAGNOSIS — F4310 Post-traumatic stress disorder, unspecified: Secondary | ICD-10-CM | POA: Diagnosis not present

## 2019-07-14 DIAGNOSIS — M1990 Unspecified osteoarthritis, unspecified site: Secondary | ICD-10-CM | POA: Diagnosis not present

## 2019-07-14 DIAGNOSIS — Z87891 Personal history of nicotine dependence: Secondary | ICD-10-CM | POA: Diagnosis not present

## 2019-07-14 DIAGNOSIS — F329 Major depressive disorder, single episode, unspecified: Secondary | ICD-10-CM | POA: Diagnosis not present

## 2019-07-14 DIAGNOSIS — L304 Erythema intertrigo: Secondary | ICD-10-CM | POA: Diagnosis not present

## 2019-07-24 DIAGNOSIS — Z01812 Encounter for preprocedural laboratory examination: Secondary | ICD-10-CM | POA: Diagnosis not present

## 2019-07-24 DIAGNOSIS — K529 Noninfective gastroenteritis and colitis, unspecified: Secondary | ICD-10-CM | POA: Diagnosis not present

## 2019-10-26 DIAGNOSIS — T43595S Adverse effect of other antipsychotics and neuroleptics, sequela: Secondary | ICD-10-CM | POA: Diagnosis not present

## 2019-10-26 DIAGNOSIS — Z0001 Encounter for general adult medical examination with abnormal findings: Secondary | ICD-10-CM | POA: Diagnosis not present

## 2024-08-13 DEATH — deceased
